# Patient Record
Sex: Female | Born: 1975 | Race: White | Hispanic: No | Marital: Married | Smoking: Former smoker
Health system: Southern US, Community
[De-identification: ages and names within clinical notes are randomized; demographics above are authoritative.]

## PROBLEM LIST (undated history)

## (undated) DIAGNOSIS — IMO0001 Reserved for inherently not codable concepts without codable children: Secondary | ICD-10-CM

## (undated) DIAGNOSIS — E079 Disorder of thyroid, unspecified: Secondary | ICD-10-CM

## (undated) DIAGNOSIS — N390 Urinary tract infection, site not specified: Secondary | ICD-10-CM

## (undated) DIAGNOSIS — N12 Tubulo-interstitial nephritis, not specified as acute or chronic: Secondary | ICD-10-CM

## (undated) DIAGNOSIS — K219 Gastro-esophageal reflux disease without esophagitis: Secondary | ICD-10-CM

## (undated) HISTORY — PX: OTHER SURGICAL HISTORY: SHX169

## (undated) HISTORY — PX: TUBAL LIGATION: SHX77

## (undated) HISTORY — PX: CHOLECYSTECTOMY: SHX55

---

## 1999-11-07 ENCOUNTER — Other Ambulatory Visit: Admission: RE | Admit: 1999-11-07 | Discharge: 1999-11-07 | Payer: Self-pay | Admitting: Obstetrics and Gynecology

## 2001-01-09 ENCOUNTER — Other Ambulatory Visit: Admission: RE | Admit: 2001-01-09 | Discharge: 2001-01-09 | Payer: Self-pay | Admitting: Obstetrics and Gynecology

## 2001-02-11 ENCOUNTER — Encounter (INDEPENDENT_AMBULATORY_CARE_PROVIDER_SITE_OTHER): Payer: Self-pay | Admitting: Specialist

## 2001-02-11 ENCOUNTER — Other Ambulatory Visit: Admission: RE | Admit: 2001-02-11 | Discharge: 2001-02-11 | Payer: Self-pay | Admitting: Obstetrics and Gynecology

## 2008-07-20 ENCOUNTER — Ambulatory Visit: Payer: Self-pay | Admitting: Diagnostic Radiology

## 2008-07-20 ENCOUNTER — Emergency Department (HOSPITAL_BASED_OUTPATIENT_CLINIC_OR_DEPARTMENT_OTHER): Admission: EM | Admit: 2008-07-20 | Discharge: 2008-07-20 | Payer: Self-pay | Admitting: Emergency Medicine

## 2011-06-12 ENCOUNTER — Other Ambulatory Visit: Payer: Self-pay | Admitting: Occupational Medicine

## 2011-06-12 ENCOUNTER — Ambulatory Visit: Payer: Self-pay

## 2011-06-12 DIAGNOSIS — R52 Pain, unspecified: Secondary | ICD-10-CM

## 2011-10-12 ENCOUNTER — Encounter (HOSPITAL_BASED_OUTPATIENT_CLINIC_OR_DEPARTMENT_OTHER): Payer: Self-pay | Admitting: *Deleted

## 2011-10-12 ENCOUNTER — Emergency Department (HOSPITAL_BASED_OUTPATIENT_CLINIC_OR_DEPARTMENT_OTHER)
Admission: EM | Admit: 2011-10-12 | Discharge: 2011-10-12 | Disposition: A | Discharge status: Home / Self Care | Payer: 59 | Attending: Emergency Medicine | Admitting: Emergency Medicine

## 2011-10-12 ENCOUNTER — Emergency Department (INDEPENDENT_AMBULATORY_CARE_PROVIDER_SITE_OTHER): Payer: 59

## 2011-10-12 DIAGNOSIS — B9789 Other viral agents as the cause of diseases classified elsewhere: Secondary | ICD-10-CM | POA: Insufficient documentation

## 2011-10-12 DIAGNOSIS — R05 Cough: Secondary | ICD-10-CM

## 2011-10-12 DIAGNOSIS — R509 Fever, unspecified: Secondary | ICD-10-CM

## 2011-10-12 DIAGNOSIS — R059 Cough, unspecified: Secondary | ICD-10-CM

## 2011-10-12 DIAGNOSIS — R42 Dizziness and giddiness: Secondary | ICD-10-CM

## 2011-10-12 DIAGNOSIS — J3489 Other specified disorders of nose and nasal sinuses: Secondary | ICD-10-CM

## 2011-10-12 DIAGNOSIS — R300 Dysuria: Secondary | ICD-10-CM | POA: Insufficient documentation

## 2011-10-12 DIAGNOSIS — J069 Acute upper respiratory infection, unspecified: Secondary | ICD-10-CM | POA: Insufficient documentation

## 2011-10-12 DIAGNOSIS — B349 Viral infection, unspecified: Secondary | ICD-10-CM

## 2011-10-12 DIAGNOSIS — E860 Dehydration: Secondary | ICD-10-CM | POA: Insufficient documentation

## 2011-10-12 LAB — URINALYSIS, ROUTINE W REFLEX MICROSCOPIC
Glucose, UA: NEGATIVE mg/dL
Ketones, ur: NEGATIVE mg/dL
Leukocytes, UA: NEGATIVE
Nitrite: NEGATIVE
Protein, ur: NEGATIVE mg/dL
Urobilinogen, UA: 0.2 mg/dL (ref 0.0–1.0)

## 2011-10-12 LAB — COMPREHENSIVE METABOLIC PANEL
Alkaline Phosphatase: 32 U/L — ABNORMAL LOW (ref 39–117)
BUN: 9 mg/dL (ref 6–23)
Chloride: 109 mEq/L (ref 96–112)
Creatinine, Ser: 0.7 mg/dL (ref 0.50–1.10)
GFR calc Af Amer: >90 mL/min (ref >90)
GFR calc non Af Amer: >90 mL/min (ref >90)
Glucose, Bld: 83 mg/dL (ref 70–99)
Potassium: 3.7 mEq/L (ref 3.5–5.1)
Total Bilirubin: 0.1 mg/dL — ABNORMAL LOW (ref 0.3–1.2)

## 2011-10-12 LAB — DIFFERENTIAL
Lymphs Abs: 1 10*3/uL (ref 0.7–4.0)
Monocytes Absolute: 0.6 10*3/uL (ref 0.1–1.0)
Monocytes Relative: 14 % — ABNORMAL HIGH (ref 3–12)
Neutro Abs: 2.7 10*3/uL (ref 1.7–7.7)
Neutrophils Relative %: 62 % (ref 43–77)

## 2011-10-12 LAB — URINE MICROSCOPIC-ADD ON

## 2011-10-12 LAB — CBC
HCT: 31.8 % — ABNORMAL LOW (ref 36.0–46.0)
Hemoglobin: 10.7 g/dL — ABNORMAL LOW (ref 12.0–15.0)
MCH: 32.2 pg (ref 26.0–34.0)
RBC: 3.32 MIL/uL — ABNORMAL LOW (ref 3.87–5.11)

## 2011-10-12 MED ORDER — SODIUM CHLORIDE 0.9 % IV BOLUS (SEPSIS)
1000.0000 mL | Freq: Once | INTRAVENOUS | Status: AC
  Administered 2011-10-12: 1000 mL via INTRAVENOUS

## 2011-10-12 MED ORDER — OXYMETAZOLINE HCL 0.05 % NA SOLN
2.0000 | Freq: Once | NASAL | Status: AC
  Administered 2011-10-12: 2 via NASAL
  Filled 2011-10-12: qty 15

## 2011-10-12 MED ORDER — KETOROLAC TROMETHAMINE 30 MG/ML IJ SOLN
30.0000 mg | Freq: Once | INTRAMUSCULAR | Status: AC
  Administered 2011-10-12: 30 mg via INTRAVENOUS
  Filled 2011-10-12: qty 1

## 2011-10-12 NOTE — ED Notes (Signed)
Patient c/o generalized body aches, cough, sinus congestion, diarrhea/vomiting over the  Past week, lack of appetite

## 2011-10-12 NOTE — Discharge Instructions (Signed)
Upper Respiratory Infection, Adult An upper respiratory infection (URI) is also sometimes known as the common cold. The upper respiratory tract includes the nose, sinuses, throat, trachea, and bronchi. Bronchi are the airways leading to the lungs. Most people improve within 1 week, but symptoms can last up to 2 weeks. A residual cough may last even longer.  CAUSES Many different viruses can infect the tissues lining the upper respiratory tract. The tissues become irritated and inflamed and often become very moist. Mucus production is also common. A cold is contagious. You can easily spread the virus to others by oral contact. This includes kissing, sharing a glass, coughing, or sneezing. Touching your mouth or nose and then touching a surface, which is then touched by another person, can also spread the virus. SYMPTOMS  Symptoms typically develop 1 to 3 days after you come in contact with a cold virus. Symptoms vary from person to person. They may include:  Runny nose.   Sneezing.   Nasal congestion.   Sinus irritation.   Sore throat.   Loss of voice (laryngitis).   Cough.   Fatigue.   Muscle aches.   Loss of appetite.   Headache.   Low-grade fever.  DIAGNOSIS  You might diagnose your own cold based on familiar symptoms, since most people get a cold 2 to 3 times a year. Your caregiver can confirm this based on your exam. Most importantly, your caregiver can check that your symptoms are not due to another disease such as strep throat, sinusitis, pneumonia, asthma, or epiglottitis. Blood tests, throat tests, and X-rays are not necessary to diagnose a common cold, but they may sometimes be helpful in excluding other more serious diseases. Your caregiver will decide if any further tests are required. RISKS AND COMPLICATIONS  You may be at risk for a more severe case of the common cold if you smoke cigarettes, have chronic heart disease (such as heart failure) or lung disease (such as  asthma), or if you have a weakened immune system. The very young and very old are also at risk for more serious infections. Bacterial sinusitis, middle ear infections, and bacterial pneumonia can complicate the common cold. The common cold can worsen asthma and chronic obstructive pulmonary disease (COPD). Sometimes, these complications can require emergency medical care and may be life-threatening. PREVENTION  The best way to protect against getting a cold is to practice good hygiene. Avoid oral or hand contact with people with cold symptoms. Wash your hands often if contact occurs. There is no clear evidence that vitamin C, vitamin E, echinacea, or exercise reduces the chance of developing a cold. However, it is always recommended to get plenty of rest and practice good nutrition. TREATMENT  Treatment is directed at relieving symptoms. There is no cure. Antibiotics are not effective, because the infection is caused by a virus, not by bacteria. Treatment may include:  Increased fluid intake. Sports drinks offer valuable electrolytes, sugars, and fluids.   Breathing heated mist or steam (vaporizer or shower).   Eating chicken soup or other clear broths, and maintaining good nutrition.   Getting plenty of rest.   Using gargles or lozenges for comfort.   Controlling fevers with ibuprofen or acetaminophen as directed by your caregiver.   Increasing usage of your inhaler if you have asthma.  Zinc gel and zinc lozenges, taken in the first 24 hours of the common cold, can shorten the duration and lessen the severity of symptoms. Pain medicines may help with fever, muscle   aches, and throat pain. A variety of non-prescription medicines are available to treat congestion and runny nose. Your caregiver can make recommendations and may suggest nasal or lung inhalers for other symptoms.  HOME CARE INSTRUCTIONS   Only take over-the-counter or prescription medicines for pain, discomfort, or fever as directed  by your caregiver.   Use a warm mist humidifier or inhale steam from a shower to increase air moisture. This may keep secretions moist and make it easier to breathe.   Drink enough water and fluids to keep your urine clear or pale yellow.   Rest as needed.   Return to work when your temperature has returned to normal or as your caregiver advises. You may need to stay home longer to avoid infecting others. You can also use a face mask and careful hand washing to prevent spread of the virus.  SEEK MEDICAL CARE IF:   After the first few days, you feel you are getting worse rather than better.   You need your caregiver's advice about medicines to control symptoms.   You develop chills, worsening shortness of breath, or brown or red sputum. These may be signs of pneumonia.   You develop yellow or brown nasal discharge or pain in the face, especially when you bend forward. These may be signs of sinusitis.   You develop a fever, swollen neck glands, pain with swallowing, or white areas in the back of your throat. These may be signs of strep throat.  SEEK IMMEDIATE MEDICAL CARE IF:   You have a fever.   You develop severe or persistent headache, ear pain, sinus pain, or chest pain.   You develop wheezing, a prolonged cough, cough up blood, or have a change in your usual mucus (if you have chronic lung disease).   You develop sore muscles or a stiff neck.  Document Released: 12/27/2000 Document Revised: 06/22/2011 Document Reviewed: 11/04/2010 [facility] Patient Information 2012 [place], [place].Dehydration, Adult Dehydration is when you lose more fluids from the body than you take in. Vital organs like the kidneys, brain, and heart cannot function without a proper amount of fluids and salt. Any loss of fluids from the body can cause dehydration.  CAUSES   Vomiting.   Diarrhea.   Excessive sweating.   Excessive urine output.   Fever.  SYMPTOMS  Mild dehydration  Thirst.   Dry  lips.   Slightly dry mouth.  Moderate dehydration  Very dry mouth.   Sunken eyes.   Skin does not bounce back quickly when lightly pinched and released.   Dark urine and decreased urine production.   Decreased tear production.   Headache.  Severe dehydration  Very dry mouth.   Extreme thirst.   Rapid, weak pulse (more than 100 beats per minute at rest).   Cold hands and feet.   Not able to sweat in spite of heat and temperature.   Rapid breathing.   Blue lips.   Confusion and lethargy.   Difficulty being awakened.   Minimal urine production.   No tears.  DIAGNOSIS  Your caregiver will diagnose dehydration based on your symptoms and your exam. Blood and urine tests will help confirm the diagnosis. The diagnostic evaluation should also identify the cause of dehydration. TREATMENT  Treatment of mild or moderate dehydration can often be done at home by increasing the amount of fluids that you drink. It is best to drink small amounts of fluid more often. Drinking too much at one time can make vomiting worse. Refer  to the home care instructions below. Severe dehydration needs to be treated at the hospital where you will probably be given intravenous (IV) fluids that contain water and electrolytes. HOME CARE INSTRUCTIONS   Ask your caregiver about specific rehydration instructions.   Drink enough fluids to keep your urine clear or pale yellow.   Drink small amounts frequently if you have nausea and vomiting.   Eat as you normally do.   Avoid:   Foods or drinks high in sugar.   Carbonated drinks.   Juice.   Extremely hot or cold fluids.   Drinks with caffeine.   Fatty, greasy foods.   Alcohol.   Tobacco.   Overeating.   Gelatin desserts.   Wash your hands well to avoid spreading bacteria and viruses.   Only take over-the-counter or prescription medicines for pain, discomfort, or fever as directed by your caregiver.   Ask your caregiver if you  should continue all prescribed and over-the-counter medicines.   Keep all follow-up appointments with your caregiver.  SEEK MEDICAL CARE IF:  You have abdominal pain and it increases or stays in one area (localizes).   You have a rash, stiff neck, or severe headache.   You are irritable, sleepy, or difficult to awaken.   You are weak, dizzy, or extremely thirsty.  SEEK IMMEDIATE MEDICAL CARE IF:   You are unable to keep fluids down or you get worse despite treatment.   You have frequent episodes of vomiting or diarrhea.   You have blood or green matter (bile) in your vomit.   You have blood in your stool or your stool looks black and tarry.   You have not urinated in 6 to 8 hours, or you have only urinated a small amount of very dark urine.   You have a fever.   You faint.  MAKE SURE YOU:   Understand these instructions.   Will watch your condition.   Will get help right away if you are not doing well or get worse.  Document Released: 07/03/2005 Document Revised: 06/22/2011 Document Reviewed: 02/20/2011 [facility] Patient Information 2012 [place], [place].

## 2011-10-12 NOTE — ED Notes (Signed)
Monday began to have sore throat cough congestion high fever with hallucinations chills eyes itching and watering 2 weeks ago while on vacation had GI bug with nausea and vomiting that lasted over a week.

## 2011-10-12 NOTE — ED Provider Notes (Signed)
History     CSN: [id number]  Arrival date & time 10/12/11  1191   First MD Initiated Contact with Patient 10/12/11 [phone]      Chief Complaint  Patient presents with  . Cough  . Nasal Congestion  . Dizziness  . Fever    (Consider location/radiation/quality/duration/timing/severity/associated sxs/prior treatment) HPI Patient is a 36 year old female who presents today complaining of congestion, sore throat, and dry cough over the past 3 days. Patient also describes feeling lightheaded with this and is tachycardic on presentation. She has had fevers at home as high as the 102. Patient also endorses urinary frequency as well as mild dysuria. This is similar to prior UTIs she has had. She does not suspect that she could be pregnant. Though the patient currently has no abdominal pain or other GI symptoms she does note that last week for 5-7 days that she had nausea, vomiting, and diarrhea with abdominal pain. During this time she was on vacation in [place]. All of these symptoms have resolved. Patient has tried over-the-counter remedies without relief.There are no other associated or modifying factors.  History reviewed. No pertinent past medical history.  History reviewed. No pertinent past surgical history.  History reviewed. No pertinent family history.  History  Substance Use Topics  . Smoking status: Former Games developer  . Smokeless tobacco: Not on file  . Alcohol Use: Yes    OB History    Grav Para Term Preterm Abortions TAB SAB Ect Mult Living                  Review of Systems  Constitutional: Positive for fever, chills and fatigue.  HENT: Positive for congestion and sore throat.   Eyes: Negative.   Respiratory: Positive for cough.   Cardiovascular: Negative.   Gastrointestinal: Negative.   Genitourinary: Positive for dysuria and frequency.  Musculoskeletal: Negative.   Skin: Negative.   Neurological: Positive for light-headedness.  Hematological: Negative.     Psychiatric/Behavioral: Negative.   All other systems reviewed and are negative.    Allergies  Penicillins and Zithromax  Home Medications   Current Outpatient Rx  Name Route Sig Dispense Refill  . FLUOXETINE HCL 10 MG PO CAPS Oral Take 20 mg by mouth daily.      BP 126/78  Pulse 110  Temp(Src) 99 F (37.2 C) (Oral)  Resp 20  Ht 5\' 9"  (1.753 m)  Wt 149 lb 6 oz (67.756 kg)  BMI 22.06 kg/m2  SpO2 100%  LMP 09/28/2011  Physical Exam GEN: Well-developed, well-nourished female in no distress. uncomfortable HEENT: Atraumatic, normocephalic. Symmetric erythematous posterior oropharynx without cobblestoning or exudate. Bilateral nasal congestion.  EYES: PERRLA BL, no scleral icterus. NECK: Trachea midline, no meningismus CV: Tachycardia with regular rhythm. No murmurs, rubs, or gallops PULM: No respiratory distress.  No crackles, wheezes, or rales. GI: soft, non-tender. No guarding, rebound, or tenderness. + bowel sounds  GU: deferred Neuro: cranial nerves 2-12 intact, no abnormalities of strength or sensation, A and O x 3 MSK: Patient moves all 4 extremities symmetrically, no deformity, edema, or injury noted Skin: No rashes petechiae, purpura, or jaundice Psych: no abnormality of mood  ED Course  Procedures (including critical care time)  Labs Reviewed  CBC - Abnormal; Notable for the following:    RBC 3.32 (*)    Hemoglobin 10.7 (*)    HCT 31.8 (*)    All other components within normal limits  DIFFERENTIAL - Abnormal; Notable for the following:    Monocytes Relative  14 (*)    All other components within normal limits  COMPREHENSIVE METABOLIC PANEL - Abnormal; Notable for the following:    Calcium 8.1 (*)    Total Protein 5.8 (*)    Albumin 3.3 (*)    Alkaline Phosphatase 32 (*)    Total Bilirubin 0.1 (*)    All other components within normal limits  URINALYSIS, ROUTINE W REFLEX MICROSCOPIC - Abnormal; Notable for the following:    APPearance CLOUDY (*)    Hgb  urine dipstick LARGE (*)    All other components within normal limits  URINE MICROSCOPIC-ADD ON - Abnormal; Notable for the following:    Squamous Epithelial / LPF MANY (*)    Bacteria, UA MANY (*)    All other components within normal limits  PREGNANCY, URINE   Dg Chest 2 View  10/12/2011  *RADIOLOGY REPORT*  Clinical Data: Cough, congestion, fever  CHEST - 2 VIEW  Comparison: None.  Findings: The cardiac silhouette, mediastinum, pulmonary vasculature are within normal limits.  Both lungs are clear. There is no acute bony abnormality.  IMPRESSION: There is no evidence of acute cardiac or pulmonary process.  Original Report Authenticated By: Brandon Melnick, M.D.     1. Acute URI   2. Dehydration   3. Viral syndrome       MDM  Patient was evaluated by myself. Based on presentation most likely diagnosis was upper respiratory infection with associated dehydration. Chest x-ray was performed to confirm that the patient did not have a pneumonia. This was negative. Urinalysis also was within normal limits. Patient was rehydrated with 2 L normal saline IV bolus. She received Toradol as well as Afrin and was feeling much better. She was discharged with instructions to use warm salt water gargle for throat as well as ibuprofen and Tylenol. She also was told that she can use Afrin for 3 days. She can use saline nasal spray as well as increased fluid intake as well. Patient was comfortable with plan for discharge home and was discharged home in improved condition.        Cyndra Numbers, MD 10/12/11 1259

## 2012-05-04 ENCOUNTER — Emergency Department (HOSPITAL_BASED_OUTPATIENT_CLINIC_OR_DEPARTMENT_OTHER)
Admission: EM | Admit: 2012-05-04 | Discharge: 2012-05-04 | Disposition: A | Discharge status: Home / Self Care | Payer: Managed Care, Other (non HMO) | Attending: Emergency Medicine | Admitting: Emergency Medicine

## 2012-05-04 ENCOUNTER — Encounter (HOSPITAL_BASED_OUTPATIENT_CLINIC_OR_DEPARTMENT_OTHER): Payer: Self-pay | Admitting: *Deleted

## 2012-05-04 DIAGNOSIS — N12 Tubulo-interstitial nephritis, not specified as acute or chronic: Secondary | ICD-10-CM | POA: Insufficient documentation

## 2012-05-04 HISTORY — DX: Urinary tract infection, site not specified: N39.0

## 2012-05-04 HISTORY — DX: Gastro-esophageal reflux disease without esophagitis: K21.9

## 2012-05-04 HISTORY — DX: Reserved for inherently not codable concepts without codable children: IMO0001

## 2012-05-04 HISTORY — DX: Tubulo-interstitial nephritis, not specified as acute or chronic: N12

## 2012-05-04 LAB — URINALYSIS, ROUTINE W REFLEX MICROSCOPIC
Glucose, UA: NEGATIVE mg/dL
Protein, ur: NEGATIVE mg/dL
Specific Gravity, Urine: 1.005 (ref 1.005–1.030)

## 2012-05-04 LAB — PREGNANCY, URINE: Preg Test, Ur: NEGATIVE

## 2012-05-04 LAB — URINE MICROSCOPIC-ADD ON

## 2012-05-04 MED ORDER — CIPROFLOXACIN HCL 500 MG PO TABS: 500.0000 mg | ORAL_TABLET | Freq: Two times a day (BID) | ORAL | Status: DC

## 2012-05-04 MED ORDER — PHENAZOPYRIDINE HCL 100 MG PO TABS
200.0000 mg | ORAL_TABLET | Freq: Once | ORAL | Status: AC
  Administered 2012-05-04: 200 mg via ORAL
  Filled 2012-05-04 (×2): qty 1

## 2012-05-04 MED ORDER — PHENAZOPYRIDINE HCL 200 MG PO TABS: 200.0000 mg | ORAL_TABLET | Freq: Three times a day (TID) | ORAL | Status: DC

## 2012-05-04 MED ORDER — CIPROFLOXACIN HCL 500 MG PO TABS
500.0000 mg | ORAL_TABLET | Freq: Once | ORAL | Status: AC
  Administered 2012-05-04: 500 mg via ORAL
  Filled 2012-05-04: qty 1

## 2012-05-04 NOTE — ED Notes (Addendum)
Pt has hx of bladder reflux, UTI and has had s/s x 1 week. Now moving into right flank area. Has been hospitalized with pyelonephritis before.

## 2012-05-04 NOTE — ED Provider Notes (Signed)
History     CSN: [id number]  Arrival date & time 05/04/12  2244   First MD Initiated Contact with Patient 05/04/12 2330      Chief Complaint  Patient presents with  . Urinary Tract Infection    (Consider location/radiation/quality/duration/timing/severity/associated sxs/prior treatment) HPI Is a 36 year old white female with a history of frequent urinary tract infections. She's had a week of suprapubic discomfort and moderate to severe burning with urination. She attempted to treat herself with increased fluid intake without success. She is now having pain in her right flank consistent with previous episodes of pyelonephritis. She describes the pain as a throbbing. It is moderate severity. She's not aware of having a fever but has had chills and nausea.  Past Medical History  Diagnosis Date  . UTI (lower urinary tract infection)   . Reflux   . Pyelonephritis     History reviewed. No pertinent past surgical history.  History reviewed. No pertinent family history.  History  Substance Use Topics  . Smoking status: Former Games developer  . Smokeless tobacco: Not on file  . Alcohol Use: Yes    OB History    Grav Para Term Preterm Abortions TAB SAB Ect Mult Living                  Review of Systems  All other systems reviewed and are negative.    Allergies  Penicillins and Zithromax  Home Medications   Current Outpatient Rx  Name Route Sig Dispense Refill  . FLUOXETINE HCL 10 MG PO CAPS Oral Take 20 mg by mouth daily.      BP 138/86  Pulse 102  Temp 98.4 F (36.9 C) (Oral)  Resp 18  Ht 5\' 9"  (1.753 m)  Wt 146 lb (66.225 kg)  BMI 21.56 kg/m2  SpO2 100%  LMP 04/27/2012  Physical Exam General: Well-developed, well-nourished female in no acute distress; appearance consistent with age of record HENT: normocephalic, atraumatic Eyes: pupils equal round and reactive to light; extraocular muscles intact Neck: supple Heart: regular rate and rhythm Lungs: clear to  auscultation bilaterally Abdomen: soft; nondistended; suprapubic tenderness; no masses or hepatosplenomegaly; bowel sounds present GU: Right CVA tenderness Extremities: No deformity; full range of motion Neurologic: Awake, alert and oriented; motor function intact in all extremities and symmetric; no facial droop Skin: Warm and dry Psychiatric: Normal mood and affect    ED Course  Procedures (including critical care time)    MDM   Nursing notes and vitals signs, including pulse oximetry, reviewed.  Summary of this visit's results, reviewed by myself:  Labs:  Results for orders placed during the hospital encounter of 05/04/12  URINALYSIS, ROUTINE W REFLEX MICROSCOPIC      Component Value Range   Color, Urine ORANGE (*) YELLOW   APPearance CLOUDY (*) CLEAR   Specific Gravity, Urine 1.005  1.005 - 1.030   pH 6.0  5.0 - 8.0   Glucose, UA NEGATIVE  NEGATIVE mg/dL   Hgb urine dipstick TRACE (*) NEGATIVE   Bilirubin Urine NEGATIVE  NEGATIVE   Ketones, ur NEGATIVE  NEGATIVE mg/dL   Protein, ur NEGATIVE  NEGATIVE mg/dL   Urobilinogen, UA 1.0  0.0 - 1.0 mg/dL   Nitrite POSITIVE (*) NEGATIVE   Leukocytes, UA LARGE (*) NEGATIVE  PREGNANCY, URINE      Component Value Range   Preg Test, Ur NEGATIVE  NEGATIVE  URINE MICROSCOPIC-ADD ON      Component Value Range   Squamous Epithelial / LPF FEW (*)  RARE   WBC, UA 21-50  <3 WBC/hpf   RBC / HPF 0-2  <3 RBC/hpf   Bacteria, UA FEW (*) RARE           Hanley Seamen, MD 05/04/12 2337

## 2012-05-07 LAB — URINE CULTURE: Colony Count: >=100000

## 2012-05-08 NOTE — ED Notes (Signed)
+   Urine Patient treated with cipro-sensitive to same-chart appended per protocol MD. 

## 2012-06-16 ENCOUNTER — Emergency Department (HOSPITAL_BASED_OUTPATIENT_CLINIC_OR_DEPARTMENT_OTHER): Payer: Managed Care, Other (non HMO)

## 2012-06-16 ENCOUNTER — Emergency Department (HOSPITAL_BASED_OUTPATIENT_CLINIC_OR_DEPARTMENT_OTHER)
Admission: EM | Admit: 2012-06-16 | Discharge: 2012-06-16 | Disposition: A | Discharge status: Home / Self Care | Payer: Managed Care, Other (non HMO) | Attending: Emergency Medicine | Admitting: Emergency Medicine

## 2012-06-16 ENCOUNTER — Encounter (HOSPITAL_BASED_OUTPATIENT_CLINIC_OR_DEPARTMENT_OTHER): Payer: Self-pay | Admitting: Emergency Medicine

## 2012-06-16 DIAGNOSIS — Z87448 Personal history of other diseases of urinary system: Secondary | ICD-10-CM | POA: Insufficient documentation

## 2012-06-16 DIAGNOSIS — W010XXA Fall on same level from slipping, tripping and stumbling without subsequent striking against object, initial encounter: Secondary | ICD-10-CM | POA: Insufficient documentation

## 2012-06-16 DIAGNOSIS — Z87440 Personal history of urinary (tract) infections: Secondary | ICD-10-CM | POA: Insufficient documentation

## 2012-06-16 DIAGNOSIS — Y929 Unspecified place or not applicable: Secondary | ICD-10-CM | POA: Insufficient documentation

## 2012-06-16 DIAGNOSIS — S300XXA Contusion of lower back and pelvis, initial encounter: Secondary | ICD-10-CM

## 2012-06-16 DIAGNOSIS — Z8719 Personal history of other diseases of the digestive system: Secondary | ICD-10-CM | POA: Insufficient documentation

## 2012-06-16 DIAGNOSIS — Y93K1 Activity, walking an animal: Secondary | ICD-10-CM | POA: Insufficient documentation

## 2012-06-16 DIAGNOSIS — Z87891 Personal history of nicotine dependence: Secondary | ICD-10-CM | POA: Insufficient documentation

## 2012-06-16 MED ORDER — HYDROCODONE-ACETAMINOPHEN 5-500 MG PO TABS: 1.0000 | ORAL_TABLET | Freq: Four times a day (QID) | ORAL | Status: DC | PRN

## 2012-06-16 NOTE — ED Notes (Signed)
Pt fell over her dog and landed onto her tailbone.  Pt c/o pain at gluteal fold and radiates up to the left.  No decreased sensation or movement.  No elimination problems.

## 2012-06-16 NOTE — ED Provider Notes (Signed)
History     CSN: [id number]  Arrival date & time 06/16/12  1037   First MD Initiated Contact with Patient 06/16/12 1120      Chief Complaint  Patient presents with  . Fall  . Tailbone Pain    (Consider location/radiation/quality/duration/timing/severity/associated sxs/prior treatment) HPI Comments: Larey Seat while walking dog two days ago, injured tailbone.  Pain worse this morning.  Patient is a 36 y.o. female presenting with fall. The history is provided by the patient.  Fall The accident occurred 2 days ago. The fall occurred while walking. She fell from a height of 1 to 2 ft. She landed on dirt. There was no blood loss. Point of impact: buttock. Pain location: buttock. The pain is moderate.    Past Medical History  Diagnosis Date  . UTI (lower urinary tract infection)   . Reflux   . Pyelonephritis     Past Surgical History  Procedure Date  . Arm surgery     No family history on file.  History  Substance Use Topics  . Smoking status: Former Games developer  . Smokeless tobacco: Not on file  . Alcohol Use: Yes    OB History    Grav Para Term Preterm Abortions TAB SAB Ect Mult Living                  Review of Systems  All other systems reviewed and are negative.    Allergies  Erythromycin; Penicillins; Rocephin; and Zithromax  Home Medications   Current Outpatient Rx  Name  Route  Sig  Dispense  Refill  . ETONOGESTREL-ETHINYL ESTRADIOL 0.12-0.015 MG/24HR VA RING   Vaginal   Place 1 each vaginally every 28 (twenty-eight) days. Insert vaginally and leave in place for 3 consecutive weeks, then remove for 1 week.         . IBUPROFEN 200 MG PO TABS   Oral   Take 600 mg by mouth every 6 (six) hours as needed.           BP 120/76  Pulse 98  Temp 98.1 F (36.7 C) (Oral)  Resp 16  SpO2 100%  LMP 06/02/2012  Physical Exam  Nursing note and vitals reviewed. Constitutional: She is oriented to person, place, and time. She appears well-developed and  well-nourished.  HENT:  Head: Normocephalic and atraumatic.  Neck: Normal range of motion. Neck supple.  Musculoskeletal:       There is ttp over the tailbone/coccyx.  Strength, motor, sensation intact in both lower extremities.  Neurological: She is alert and oriented to person, place, and time.  Skin: Skin is warm and dry.    ED Course  Procedures (including critical care time)  Labs Reviewed - No data to display No results found.   No diagnosis found.    MDM  The xrays are negative.  Will treat with pain meds, pillow, stool softener.        Geoffery Lyons, MD 06/16/12 [phone]

## 2012-08-31 ENCOUNTER — Other Ambulatory Visit: Payer: Self-pay

## 2013-01-02 ENCOUNTER — Emergency Department (HOSPITAL_BASED_OUTPATIENT_CLINIC_OR_DEPARTMENT_OTHER)
Admission: EM | Admit: 2013-01-02 | Discharge: 2013-01-02 | Disposition: A | Discharge status: Home / Self Care | Payer: Managed Care, Other (non HMO) | Attending: Emergency Medicine | Admitting: Emergency Medicine

## 2013-01-02 ENCOUNTER — Encounter (HOSPITAL_BASED_OUTPATIENT_CLINIC_OR_DEPARTMENT_OTHER): Payer: Self-pay

## 2013-01-02 DIAGNOSIS — M545 Low back pain, unspecified: Secondary | ICD-10-CM | POA: Insufficient documentation

## 2013-01-02 DIAGNOSIS — R112 Nausea with vomiting, unspecified: Secondary | ICD-10-CM | POA: Insufficient documentation

## 2013-01-02 DIAGNOSIS — Z8639 Personal history of other endocrine, nutritional and metabolic disease: Secondary | ICD-10-CM | POA: Insufficient documentation

## 2013-01-02 DIAGNOSIS — R109 Unspecified abdominal pain: Secondary | ICD-10-CM | POA: Insufficient documentation

## 2013-01-02 DIAGNOSIS — Z87448 Personal history of other diseases of urinary system: Secondary | ICD-10-CM | POA: Insufficient documentation

## 2013-01-02 DIAGNOSIS — Z8719 Personal history of other diseases of the digestive system: Secondary | ICD-10-CM | POA: Insufficient documentation

## 2013-01-02 DIAGNOSIS — Z862 Personal history of diseases of the blood and blood-forming organs and certain disorders involving the immune mechanism: Secondary | ICD-10-CM | POA: Insufficient documentation

## 2013-01-02 DIAGNOSIS — N390 Urinary tract infection, site not specified: Secondary | ICD-10-CM

## 2013-01-02 DIAGNOSIS — R300 Dysuria: Secondary | ICD-10-CM | POA: Insufficient documentation

## 2013-01-02 DIAGNOSIS — Z880 Allergy status to penicillin: Secondary | ICD-10-CM | POA: Insufficient documentation

## 2013-01-02 DIAGNOSIS — Z87891 Personal history of nicotine dependence: Secondary | ICD-10-CM | POA: Insufficient documentation

## 2013-01-02 DIAGNOSIS — Z3202 Encounter for pregnancy test, result negative: Secondary | ICD-10-CM | POA: Insufficient documentation

## 2013-01-02 HISTORY — DX: Disorder of thyroid, unspecified: E07.9

## 2013-01-02 LAB — URINALYSIS, ROUTINE W REFLEX MICROSCOPIC
Bilirubin Urine: NEGATIVE
Glucose, UA: NEGATIVE mg/dL
Ketones, ur: NEGATIVE mg/dL
Nitrite: NEGATIVE
Protein, ur: NEGATIVE mg/dL
Specific Gravity, Urine: 1.017 (ref 1.005–1.030)
Urobilinogen, UA: 0.2 mg/dL (ref 0.0–1.0)
pH: 7.5 (ref 5.0–8.0)

## 2013-01-02 LAB — URINE MICROSCOPIC-ADD ON

## 2013-01-02 LAB — PREGNANCY, URINE: Preg Test, Ur: NEGATIVE

## 2013-01-02 MED ORDER — PROMETHAZINE HCL 25 MG PO TABS: 25.0000 mg | ORAL_TABLET | Freq: Four times a day (QID) | ORAL | Status: DC | PRN

## 2013-01-02 MED ORDER — CIPROFLOXACIN HCL 500 MG PO TABS: 500.0000 mg | ORAL_TABLET | Freq: Two times a day (BID) | ORAL | Status: DC

## 2013-01-02 MED ORDER — HYDROCODONE-ACETAMINOPHEN 5-325 MG PO TABS: ORAL_TABLET | ORAL | Status: DC

## 2013-01-02 MED ORDER — CIPROFLOXACIN HCL 500 MG PO TABS
500.0000 mg | ORAL_TABLET | Freq: Once | ORAL | Status: AC
  Administered 2013-01-02: 500 mg via ORAL
  Filled 2013-01-02: qty 1

## 2013-01-02 MED ORDER — HYDROCODONE-ACETAMINOPHEN 5-325 MG PO TABS
2.0000 | ORAL_TABLET | Freq: Once | ORAL | Status: AC
  Administered 2013-01-02: 2 via ORAL
  Filled 2013-01-02: qty 2

## 2013-01-02 MED ORDER — ONDANSETRON 8 MG PO TBDP
8.0000 mg | ORAL_TABLET | Freq: Once | ORAL | Status: AC
  Administered 2013-01-02: 8 mg via ORAL
  Filled 2013-01-02: qty 1

## 2013-01-02 NOTE — ED Notes (Signed)
Pt reports onset of low back pain Sunday and developed urinary frequency and left flank pain today.

## 2013-01-02 NOTE — Discharge Instructions (Signed)
 Urinary Tract Infection Urinary tract infections (UTIs) can develop anywhere along your urinary tract. Your urinary tract is your body's drainage system for removing wastes and extra water. Your urinary tract includes two kidneys, two ureters, a bladder, and a urethra. Your kidneys are a pair of bean-shaped organs. Each kidney is about the size of your fist. They are located below your ribs, one on each side of your spine. CAUSES Infections are caused by microbes, which are microscopic organisms, including fungi, viruses, and bacteria. These organisms are so small that they can only be seen through a microscope. Bacteria are the microbes that most commonly cause UTIs. SYMPTOMS  Symptoms of UTIs may vary by age and gender of the patient and by the location of the infection. Symptoms in young women typically include a frequent and intense urge to urinate and a painful, burning feeling in the bladder or urethra during urination. Older women and men are more likely to be tired, shaky, and weak and have muscle aches and abdominal pain. A fever may mean the infection is in your kidneys. Other symptoms of a kidney infection include pain in your back or sides below the ribs, nausea, and vomiting. DIAGNOSIS To diagnose a UTI, your caregiver will ask you about your symptoms. Your caregiver also will ask to provide a urine sample. The urine sample will be tested for bacteria and white blood cells. White blood cells are made by your body to help fight infection. TREATMENT  Typically, UTIs can be treated with medication. Because most UTIs are caused by a bacterial infection, they usually can be treated with the use of antibiotics. The choice of antibiotic and length of treatment depend on your symptoms and the type of bacteria causing your infection. HOME CARE INSTRUCTIONS  If you were prescribed antibiotics, take them exactly as your caregiver instructs you. Finish the medication even if you feel better after you  have only taken some of the medication.  Drink enough water and fluids to keep your urine clear or pale yellow.  Avoid caffeine, tea, and carbonated beverages. They tend to irritate your bladder.  Empty your bladder often. Avoid holding urine for long periods of time.  Empty your bladder before and after sexual intercourse.  After a bowel movement, women should cleanse from front to back. Use each tissue only once. SEEK MEDICAL CARE IF:   You have back pain.  You develop a fever.  Your symptoms do not begin to resolve within 3 days. SEEK IMMEDIATE MEDICAL CARE IF:   You have severe back pain or lower abdominal pain.  You develop chills.  You have nausea or vomiting.  You have continued burning or discomfort with urination. MAKE SURE YOU:   Understand these instructions.  Will watch your condition.  Will get help right away if you are not doing well or get worse. Document Released: 04/12/2005 Document Revised: 01/02/2012 Document Reviewed: 08/11/2011 [facility] Patient Information 2014 [place], [place].   Narcotic and benzodiazepine use may cause drowsiness, slowed breathing or dependence.  Please use with caution and do not drive, operate machinery or watch young children alone while taking them.  Taking combinations of these medications or drinking alcohol will potentiate these effects.

## 2013-01-02 NOTE — ED Provider Notes (Signed)
History     CSN: [id number]  Arrival date & time 01/02/13  1017   First MD Initiated Contact with Patient 01/02/13 1035      Chief Complaint  Patient presents with  . Flank Pain  . Back Pain  . Urinary Frequency    (Consider location/radiation/quality/duration/timing/severity/associated sxs/prior treatment) HPI Comments: Initially was left low back pain, thought it was muscular, did have some vomiting 3 days ago, has resolved.  She had some transient stomach pain and upset and attributed to that.  Now with urinary freq, dysuria and more frank left flank pain, similar to multiple prior kidney infections in the past.  No fevers, chills.  No diarrhea.    Patient is a 37 y.o. female presenting with flank pain, back pain, and frequency. The history is provided by the patient and medical records.  Flank Pain This is a new problem. The current episode started more than 2 days ago. The problem has been gradually worsening. Associated symptoms include abdominal pain. The symptoms are aggravated by walking and twisting. Nothing relieves the symptoms.  Back Pain Location:  Lumbar spine Radiates to:  Does not radiate Onset quality:  Gradual Chronicity:  New Associated symptoms: abdominal pain and dysuria   Associated symptoms: no fever   Abdominal pain:    Location:  L flank   Quality:  Pressure, sharp and shooting   Severity:  Severe   Onset quality:  Gradual   Timing:  Constant   Progression:  Worsening Urinary Frequency Associated symptoms include abdominal pain.    Past Medical History  Diagnosis Date  . UTI (lower urinary tract infection)   . Reflux   . Pyelonephritis   . Thyroid disease     Past Surgical History  Procedure Laterality Date  . Arm surgery    . Laproscopy      History reviewed. No pertinent family history.  History  Substance Use Topics  . Smoking status: Former Games developer  . Smokeless tobacco: Not on file  . Alcohol Use: Yes    OB History   Grav  Para Term Preterm Abortions TAB SAB Ect Mult Living                  Review of Systems  Constitutional: Negative for fever, chills and appetite change.  Gastrointestinal: Positive for nausea, vomiting and abdominal pain.  Genitourinary: Positive for dysuria, frequency and flank pain.  Musculoskeletal: Positive for back pain.  All other systems reviewed and are negative.    Allergies  Erythromycin; Penicillins; Rocephin; Sulfa antibiotics; and Zithromax  Home Medications   Current Outpatient Rx  Name  Route  Sig  Dispense  Refill  . ciprofloxacin (CIPRO) 500 MG tablet   Oral   Take 1 tablet (500 mg total) by mouth 2 (two) times daily.   20 tablet   0   . HYDROcodone-acetaminophen (NORCO/VICODIN) 5-325 MG per tablet      1-2 tablets po q 6 hours prn moderate to severe pain   20 tablet   0   . promethazine (PHENERGAN) 25 MG tablet   Oral   Take 1 tablet (25 mg total) by mouth every 6 (six) hours as needed for nausea.   20 tablet   0     BP 118/71  Pulse 109  Temp(Src) 98.4 F (36.9 C) (Oral)  Resp 16  Ht 5\' 9"  (1.753 m)  Wt 160 lb (72.576 kg)  BMI 23.62 kg/m2  SpO2 99%  Physical Exam  Nursing note and  vitals reviewed. Constitutional: She appears well-developed and well-nourished.  HENT:  Head: Normocephalic and atraumatic.  Eyes: Conjunctivae and EOM are normal. No scleral icterus.  Neck: Neck supple.  Cardiovascular: Normal rate and intact distal pulses.   Pulmonary/Chest: Effort normal. No respiratory distress.  Abdominal: Soft. She exhibits no distension. There is no tenderness.  Neurological: She is alert.  Skin: Skin is warm. No rash noted.    ED Course  Procedures (including critical care time)  Labs Reviewed  URINALYSIS, ROUTINE W REFLEX MICROSCOPIC - Abnormal; Notable for the following:    Hgb urine dipstick TRACE (*)    Leukocytes, UA MODERATE (*)    All other components within normal limits  URINE MICROSCOPIC-ADD ON - Abnormal; Notable  for the following:    Squamous Epithelial / LPF FEW (*)    Bacteria, UA MANY (*)    All other components within normal limits  URINE CULTURE  PREGNANCY, URINE   No results found.   1. Urinary tract infection     ra sat is 99% and I interpret to be normal  MDM  Pt with tenderness to left low back and flank.  UA suggests UTI, along with similar symptoms, will treat as complicated UTI with analgesics, po cipro for 10 days.  Culture sent.          Gavin Pound. Yenesis Even, MD 01/02/13 1116

## 2013-01-04 LAB — URINE CULTURE: Colony Count: >=100000

## 2013-01-05 ENCOUNTER — Telehealth (HOSPITAL_COMMUNITY): Payer: Self-pay | Admitting: Emergency Medicine

## 2013-01-05 NOTE — ED Notes (Signed)
Post ED Visit - Positive Culture Follow-up  Culture report reviewed by antimicrobial stewardship pharmacist: []  Wes Dulaney, Pharm.D., BCPS []  Celedonio Miyamoto, Pharm.D., BCPS []  Georgina Pillion, Pharm.D., BCPS []  [place], [address].D., BCPS, AAHIVP []  Estella Husk, Pharm.D., BCPS, AAHIVP [x]  Laurence Slate, [address].D., BCPS  Positive urine culture Treated with Cipro, organism sensitive to the same and no further patient follow-up is required at this time.  [name] 01/05/2013, 11:21 AM

## 2013-05-22 ENCOUNTER — Other Ambulatory Visit: Payer: Self-pay

## 2018-01-12 ENCOUNTER — Emergency Department (HOSPITAL_BASED_OUTPATIENT_CLINIC_OR_DEPARTMENT_OTHER)
Admission: EM | Admit: 2018-01-12 | Discharge: 2018-01-12 | Disposition: A | Discharge status: Home / Self Care | Payer: Managed Care, Other (non HMO) | Attending: Emergency Medicine | Admitting: Emergency Medicine

## 2018-01-12 ENCOUNTER — Encounter (HOSPITAL_BASED_OUTPATIENT_CLINIC_OR_DEPARTMENT_OTHER): Payer: Self-pay | Admitting: Emergency Medicine

## 2018-01-12 ENCOUNTER — Other Ambulatory Visit: Payer: Self-pay

## 2018-01-12 ENCOUNTER — Emergency Department (HOSPITAL_BASED_OUTPATIENT_CLINIC_OR_DEPARTMENT_OTHER): Payer: Managed Care, Other (non HMO)

## 2018-01-12 DIAGNOSIS — W450XXA Nail entering through skin, initial encounter: Secondary | ICD-10-CM | POA: Diagnosis not present

## 2018-01-12 DIAGNOSIS — Y92019 Unspecified place in single-family (private) house as the place of occurrence of the external cause: Secondary | ICD-10-CM | POA: Diagnosis not present

## 2018-01-12 DIAGNOSIS — Y9301 Activity, walking, marching and hiking: Secondary | ICD-10-CM | POA: Insufficient documentation

## 2018-01-12 DIAGNOSIS — Z23 Encounter for immunization: Secondary | ICD-10-CM | POA: Diagnosis not present

## 2018-01-12 DIAGNOSIS — E079 Disorder of thyroid, unspecified: Secondary | ICD-10-CM | POA: Insufficient documentation

## 2018-01-12 DIAGNOSIS — W010XXA Fall on same level from slipping, tripping and stumbling without subsequent striking against object, initial encounter: Secondary | ICD-10-CM | POA: Insufficient documentation

## 2018-01-12 DIAGNOSIS — Y998 Other external cause status: Secondary | ICD-10-CM | POA: Diagnosis not present

## 2018-01-12 DIAGNOSIS — Z87891 Personal history of nicotine dependence: Secondary | ICD-10-CM | POA: Diagnosis not present

## 2018-01-12 DIAGNOSIS — S91332A Puncture wound without foreign body, left foot, initial encounter: Secondary | ICD-10-CM | POA: Diagnosis not present

## 2018-01-12 DIAGNOSIS — S6992XA Unspecified injury of left wrist, hand and finger(s), initial encounter: Secondary | ICD-10-CM | POA: Diagnosis present

## 2018-01-12 DIAGNOSIS — Z79899 Other long term (current) drug therapy: Secondary | ICD-10-CM | POA: Diagnosis not present

## 2018-01-12 MED ORDER — CIPROFLOXACIN HCL 500 MG PO TABS: 500.0000 mg | ORAL_TABLET | Freq: Two times a day (BID) | ORAL | 0 refills | Status: DC

## 2018-01-12 MED ORDER — TETANUS-DIPHTH-ACELL PERTUSSIS 5-2.5-18.5 LF-MCG/0.5 IM SUSP
0.5000 mL | Freq: Once | INTRAMUSCULAR | Status: AC
  Administered 2018-01-12: 0.5 mL via INTRAMUSCULAR
  Filled 2018-01-12: qty 0.5

## 2018-01-12 NOTE — Discharge Instructions (Signed)
General instructions Do not put pressure on any part of the cast or splint until it is fully hardened. This may take several hours. Take over-the-counter and prescription medicines only as told by your health care provider. Do not use any tobacco products, including cigarettes, chewing tobacco, or e-cigarettes. Tobacco can delay bone healing. If you need help quitting, ask your health care provider. Keep all follow-up visits as told by your health care provider. This is important. Contact a health care provider if: Your pain or swelling gets worse even though you have had treatment. You have pain, numbness, or coldness in your hand or fingers. Your cast or splint becomes loose or damaged. Get help right away if: You lose feeling in your hand or fingers. Your fingers or fingernails turn pale or blue.

## 2018-01-12 NOTE — ED Provider Notes (Signed)
[facility] EMERGENCY DEPARTMENT Provider Note   CSN: [id number] Arrival date & time: 01/12/18  1745     History   Chief Complaint Chief Complaint  Patient presents with  . Foot Injury  . Fall    HPI Shari Dawson is a 42 y.o. female who presents emergency department with chief complaint of left wrist injury and puncture wound to the foot.  She has a previous history of ORIF and nonunion of the wrist.  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  This caused her to fall forward onto the left wrist.  She has pain on the radial side of the arm where she had a previous repair.  She denies any numbness or tingling.  She is unsure of her last tetanus vaccination  HPI  Past Medical History:  Diagnosis Date  . Pyelonephritis   . Reflux   . Thyroid disease   . UTI (lower urinary tract infection)     There are no active problems to display for this patient.   Past Surgical History:  Procedure Laterality Date  . arm surgery    . CHOLECYSTECTOMY    . laproscopy    . TUBAL LIGATION       OB History   None      Home Medications    Prior to Admission medications   Medication Sig Start Date End Date Taking? Authorizing Provider  lisdexamfetamine (VYVANSE) 50 MG capsule Take 50 mg by mouth daily.   Yes [provider]  ciprofloxacin (CIPRO) 500 MG tablet Take 1 tablet (500 mg total) by mouth 2 (two) times daily. 01/12/18   Arthor Captain, PA-C  HYDROcodone-acetaminophen (NORCO/VICODIN) 5-325 MG per tablet 1-2 tablets po q 6 hours prn moderate to severe pain 01/02/13   Quita Skye, MD  promethazine (PHENERGAN) 25 MG tablet Take 1 tablet (25 mg total) by mouth every 6 (six) hours as needed for nausea. 01/02/13   Quita Skye, MD    Family History No family history on file.  Social History Social History   Tobacco Use  . Smoking status: Former Games developer  . Smokeless tobacco:  Never Used  Substance Use Topics  . Alcohol use: Yes  . Drug use: No     Allergies   Erythromycin; Penicillins; Rocephin [ceftriaxone]; Sulfa antibiotics; and Zithromax [azithromycin dihydrate]   Review of Systems Review of Systems  Ten systems reviewed and are negative for acute change, except as noted in the HPI.   Physical Exam Updated Vital Signs BP 116/78 (BP Location: Right Arm)   Pulse 73   Temp 98.1 F (36.7 C) (Oral)   Resp 18   Ht 5\' 9"  (1.753 m)   Wt 72.6 kg (160 lb)   LMP 12/19/2017   SpO2 98%   BMI 23.63 kg/m   Physical Exam  Constitutional: She is oriented to person, place, and time. She appears well-developed and well-nourished. No distress.  HENT:  Head: Normocephalic and atraumatic.  Eyes: Conjunctivae are normal. No scleral icterus.  Neck: Normal range of motion.  Cardiovascular: Normal rate, regular rhythm and normal heart sounds. Exam reveals no gallop and no friction rub.  No murmur heard. Pulmonary/Chest: Effort normal and breath sounds normal. No respiratory distress.  Abdominal: Soft. Bowel sounds are normal. She exhibits no distension and no mass. There is no tenderness. There is no guarding.  Musculoskeletal:  No deformities.  Bruising to the thenar  eminence of the left hand.  Exquisite tenderness over the scaphoid.  Pain with ulnar and radial deviation of the wrist normal pulse and sensation  Neurological: She is alert and oriented to person, place, and time.  Skin: Skin is warm and dry. She is not diaphoretic.  Small puncture wound just proximal to the base of the right great toe on the plantar surface no active bleeding  Psychiatric: Her behavior is normal.  Nursing note and vitals reviewed.    ED Treatments / Results  Labs (all labs ordered are listed, but only abnormal results are displayed) Labs Reviewed - No data to display  EKG None  Radiology Dg Hand Complete Left  Result Date: 01/12/2018 CLINICAL DATA:  Patient fell on a  nail today. Hand hip nail in that would. Pain between the thumb and forefinger. EXAM: LEFT HAND - COMPLETE 3+ VIEW COMPARISON:  None. FINDINGS: Plate and screw fixation of the distal radius. Remote ulnar styloid fracture. Carpal rows are maintained. No acute fracture of the left hand and wrist. Joint spaces are intact. No radiopaque foreign body. No apparent soft tissue emphysema or puncture wound identified. IMPRESSION: 1. No acute osseous abnormality of the left hand and wrist. 2. No retained foreign bodies in the soft tissues. 3. Plate and screw fixation of the distal radius with remote ulnar styloid fracture. Electronically Signed   By: Tollie Ethavid  Kwon M.D.   On: 01/12/2018 18:26    Procedures Procedures (including critical care time)  Medications Ordered in ED Medications  Tdap (BOOSTRIX) injection 0.5 mL (0.5 mLs Intramuscular Given 01/12/18 2152)     Initial Impression / Assessment and Plan / ED Course  I have reviewed the triage vital signs and the nursing notes.  Pertinent labs & imaging results that were available during my care of the patient were reviewed by me and considered in my medical decision making (see chart for details).   Patient with puncture wound to foot, Tdap updated, patient will be discharged with ciprofloxacin.  Patient also with tenderness over the scaphoid.  Placed in thumb spica.  She will follow-up with her orthopedist.  She appears appropriate for discharge at this time without evidence of other injuries.  X-ray reviewed and negative  `  Final Clinical Impressions(s) / ED Diagnoses   Final diagnoses:  Wrist injury, left, initial encounter  Puncture wound to foot, left, initial encounter    ED Discharge Orders        Ordered    ciprofloxacin (CIPRO) 500 MG tablet  2 times daily     01/12/18 2230       Arthor CaptainHarris, Jatavis Malek, PA-C 01/12/18 2311    Little, Ambrose Finlandachel Morgan, MD 01/13/18 [phone]

## 2018-01-12 NOTE — ED Triage Notes (Signed)
Pt states she tripped and fell injuring her L hand. She also stepped on a nail with her L foot.

## 2019-02-09 ENCOUNTER — Emergency Department (HOSPITAL_COMMUNITY): Payer: Managed Care, Other (non HMO)

## 2019-02-09 ENCOUNTER — Other Ambulatory Visit: Payer: Self-pay

## 2019-02-09 ENCOUNTER — Emergency Department (HOSPITAL_COMMUNITY)
Admission: EM | Admit: 2019-02-09 | Discharge: 2019-02-09 | Disposition: A | Discharge status: Home / Self Care | Payer: Managed Care, Other (non HMO) | Attending: Emergency Medicine | Admitting: Emergency Medicine

## 2019-02-09 ENCOUNTER — Encounter (HOSPITAL_COMMUNITY): Payer: Self-pay

## 2019-02-09 ENCOUNTER — Telehealth: Payer: Managed Care, Other (non HMO) | Admitting: Family

## 2019-02-09 DIAGNOSIS — Z888 Allergy status to other drugs, medicaments and biological substances status: Secondary | ICD-10-CM | POA: Insufficient documentation

## 2019-02-09 DIAGNOSIS — R509 Fever, unspecified: Secondary | ICD-10-CM | POA: Insufficient documentation

## 2019-02-09 DIAGNOSIS — R5383 Other fatigue: Secondary | ICD-10-CM | POA: Insufficient documentation

## 2019-02-09 DIAGNOSIS — Z881 Allergy status to other antibiotic agents status: Secondary | ICD-10-CM | POA: Diagnosis not present

## 2019-02-09 DIAGNOSIS — Z880 Allergy status to penicillin: Secondary | ICD-10-CM | POA: Diagnosis not present

## 2019-02-09 DIAGNOSIS — Z882 Allergy status to sulfonamides status: Secondary | ICD-10-CM | POA: Insufficient documentation

## 2019-02-09 DIAGNOSIS — J029 Acute pharyngitis, unspecified: Secondary | ICD-10-CM | POA: Insufficient documentation

## 2019-02-09 DIAGNOSIS — Z20828 Contact with and (suspected) exposure to other viral communicable diseases: Secondary | ICD-10-CM | POA: Diagnosis not present

## 2019-02-09 DIAGNOSIS — R34 Anuria and oliguria: Secondary | ICD-10-CM

## 2019-02-09 DIAGNOSIS — Z87891 Personal history of nicotine dependence: Secondary | ICD-10-CM | POA: Diagnosis not present

## 2019-02-09 DIAGNOSIS — R220 Localized swelling, mass and lump, head: Secondary | ICD-10-CM

## 2019-02-09 LAB — COMPREHENSIVE METABOLIC PANEL
ALT: 40 U/L (ref 0–44)
AST: 38 U/L (ref 15–41)
Albumin: 3.9 g/dL (ref 3.5–5.0)
Alkaline Phosphatase: 53 U/L (ref 38–126)
Anion gap: 12 (ref 5–15)
BUN: 8 mg/dL (ref 6–20)
CO2: 26 mmol/L (ref 22–32)
Calcium: 9 mg/dL (ref 8.9–10.3)
Chloride: 99 mmol/L (ref 98–111)
Creatinine, Ser: 0.57 mg/dL (ref 0.44–1.00)
GFR calc Af Amer: >60 mL/min (ref >60)
GFR calc non Af Amer: >60 mL/min (ref >60)
Glucose, Bld: 91 mg/dL (ref 70–99)
Potassium: 3.4 mmol/L — ABNORMAL LOW (ref 3.5–5.1)
Sodium: 137 mmol/L (ref 135–145)
Total Bilirubin: 0.2 mg/dL — ABNORMAL LOW (ref 0.3–1.2)
Total Protein: 7.1 g/dL (ref 6.5–8.1)

## 2019-02-09 LAB — CBC WITH DIFFERENTIAL/PLATELET
Abs Immature Granulocytes: 0.04 10*3/uL (ref 0.00–0.07)
Basophils Absolute: 0.1 10*3/uL (ref 0.0–0.1)
Basophils Relative: 1 %
Eosinophils Absolute: 0 10*3/uL (ref 0.0–0.5)
Eosinophils Relative: 0 %
HCT: 39.2 % (ref 36.0–46.0)
Hemoglobin: 12.8 g/dL (ref 12.0–15.0)
Immature Granulocytes: 1 %
Lymphocytes Relative: 34 %
Lymphs Abs: 2.7 10*3/uL (ref 0.7–4.0)
MCH: 33.1 pg (ref 26.0–34.0)
MCHC: 32.7 g/dL (ref 30.0–36.0)
MCV: 101.3 fL — ABNORMAL HIGH (ref 80.0–100.0)
Monocytes Absolute: 0.8 10*3/uL (ref 0.1–1.0)
Monocytes Relative: 9 %
Neutro Abs: 4.6 10*3/uL (ref 1.7–7.7)
Neutrophils Relative %: 55 %
Platelets: 283 10*3/uL (ref 150–400)
RBC: 3.87 MIL/uL (ref 3.87–5.11)
RDW: 12.6 % (ref 11.5–15.5)
WBC Morphology: INCREASED
WBC: 8.2 10*3/uL (ref 4.0–10.5)
nRBC: 0 % (ref 0.0–0.2)

## 2019-02-09 LAB — URINALYSIS, ROUTINE W REFLEX MICROSCOPIC
Bilirubin Urine: NEGATIVE
Glucose, UA: NEGATIVE mg/dL
Ketones, ur: NEGATIVE mg/dL
Leukocytes,Ua: NEGATIVE
Nitrite: NEGATIVE
Protein, ur: NEGATIVE mg/dL
Specific Gravity, Urine: 1.005 (ref 1.005–1.030)
pH: 7 (ref 5.0–8.0)

## 2019-02-09 LAB — PREGNANCY, URINE: Preg Test, Ur: NEGATIVE

## 2019-02-09 LAB — GROUP A STREP BY PCR: Group A Strep by PCR: NOT DETECTED

## 2019-02-09 LAB — SARS CORONAVIRUS 2 BY RT PCR (HOSPITAL ORDER, PERFORMED IN ~~LOC~~ HOSPITAL LAB): SARS Coronavirus 2: NEGATIVE

## 2019-02-09 MED ORDER — HYDROMORPHONE HCL 1 MG/ML IJ SOLN
0.5000 mg | Freq: Once | INTRAMUSCULAR | Status: AC
  Administered 2019-02-09: 0.5 mg via INTRAVENOUS
  Filled 2019-02-09: qty 1

## 2019-02-09 MED ORDER — LACTATED RINGERS IV BOLUS
1000.0000 mL | Freq: Once | INTRAVENOUS | Status: AC
  Administered 2019-02-09: 1000 mL via INTRAVENOUS

## 2019-02-09 MED ORDER — KETOROLAC TROMETHAMINE 15 MG/ML IJ SOLN
15.0000 mg | Freq: Once | INTRAMUSCULAR | Status: AC
Start: 2019-02-09 — End: 2019-02-09
  Administered 2019-02-09: 15 mg via INTRAVENOUS
  Filled 2019-02-09: qty 1

## 2019-02-09 NOTE — ED Provider Notes (Signed)
[facility]-EMERGENCY DEPT Provider Note   CSN: [id number] Arrival date & time: 02/09/19  1721     History   Chief Complaint No chief complaint on file.   HPI Shari Dawson is a 43 y.o. female.     HPI   42yF with fever, body aches and cough. Persistent over the last ~3 days. Fatigued. "I just feel like shit." Mild sore throat. No dyspnea. No dysuria but urine has been darker in color and she feels like she has been urinating less. Little appetite but has been trying to stay hydrated. Reports #10 unintentional weight loss in past two weeks and #50 in the last year. No v/d. No rash. No recent tick bites.  No sick contacts that she is aware of.   Past Medical History:  Diagnosis Date  . Pyelonephritis   . Reflux   . Thyroid disease   . UTI (lower urinary tract infection)     There are no active problems to display for this patient.   Past Surgical History:  Procedure Laterality Date  . arm surgery    . CHOLECYSTECTOMY    . laproscopy    . TUBAL LIGATION       OB History   No obstetric history on file.      Home Medications    Prior to Admission medications   Medication Sig Start Date End Date Taking? Authorizing Provider  acetaminophen (TYLENOL) 500 MG tablet Take 1,000 mg by mouth every 6 (six) hours as needed for mild pain, moderate pain or headache.   Yes [provider]  amphetamine-dextroamphetamine (ADDERALL) 10 MG tablet Take 10 mg by mouth daily. 01/13/19  Yes [provider]  lamoTRIgine (LAMICTAL) 100 MG tablet Take 100 mg by mouth daily. 11/27/18  Yes [provider]  lisdexamfetamine (VYVANSE) 50 MG capsule Take 50 mg by mouth daily.   Yes [provider]  zaleplon (SONATA) 5 MG capsule Take 5 mg by mouth at bedtime as needed for sleep.  01/13/19  Yes [provider]    Family History No family history on file.  Social History Social History   Tobacco Use  . Smoking status: Former  Games developermoker  . Smokeless tobacco: Never Used  Substance Use Topics  . Alcohol use: Yes  . Drug use: No     Allergies   Erythromycin, Rocephin [ceftriaxone], Sulfa antibiotics, Zithromax [azithromycin dihydrate], and Penicillins   Review of Systems Review of Systems   All systems reviewed and negative, other than as noted in HPI.  Physical Exam Updated Vital Signs BP 115/82 (BP Location: Right Arm)   Pulse 76   Temp 99.7 F (37.6 C) (Oral)   Resp 17   Ht 5\' 9"  (1.753 m)   Wt 61.2 kg   LMP 01/22/2019 (Approximate)   SpO2 100%   BMI 19.94 kg/m   Physical Exam Vitals signs and nursing note reviewed.  Constitutional:      Appearance: She is well-developed.     Comments: Laying in bed. Seems tired but not toxic.   HENT:     Head: Normocephalic and atraumatic.     Right Ear: Tympanic membrane normal.     Left Ear: Tympanic membrane normal.     Mouth/Throat:     Mouth: Mucous membranes are moist.     Pharynx: Oropharynx is clear.     Comments: Mild pharyngitis. No exudate. Uvula midline. Handling secretions. Normal sounding voice. Neck supple.   Eyes:     General:  Right eye: No discharge.        Left eye: No discharge.     Conjunctiva/sclera: Conjunctivae normal.  Neck:     Musculoskeletal: Neck supple.  Cardiovascular:     Rate and Rhythm: Normal rate and regular rhythm.     Heart sounds: Normal heart sounds. No murmur. No friction rub. No gallop.   Pulmonary:     Effort: Pulmonary effort is normal. No respiratory distress.     Breath sounds: Normal breath sounds.  Abdominal:     General: There is no distension.     Palpations: Abdomen is soft.     Tenderness: There is no abdominal tenderness.  Musculoskeletal:        General: No tenderness.     Right lower leg: No edema.     Left lower leg: No edema.  Skin:    General: Skin is warm and dry.     Findings: No rash.  Neurological:     Mental Status: She is alert.  Psychiatric:        Behavior: Behavior  normal.        Thought Content: Thought content normal.      ED Treatments / Results  Labs (all labs ordered are listed, but only abnormal results are displayed) Labs Reviewed  CBC WITH DIFFERENTIAL/PLATELET - Abnormal; Notable for the following components:      Result Value   MCV 101.3 (*)    All other components within normal limits  URINALYSIS, ROUTINE W REFLEX MICROSCOPIC - Abnormal; Notable for the following components:   Hgb urine dipstick SMALL (*)    Bacteria, UA FEW (*)    All other components within normal limits  COMPREHENSIVE METABOLIC PANEL - Abnormal; Notable for the following components:   Potassium 3.4 (*)    Total Bilirubin 0.2 (*)    All other components within normal limits  SARS CORONAVIRUS 2 (HOSPITAL ORDER, PERFORMED IN [facility])  GROUP A STREP BY PCR  CULTURE, BLOOD (ROUTINE X 2)  CULTURE, BLOOD (ROUTINE X 2)  URINE CULTURE  PREGNANCY, URINE    EKG None  Radiology Dg Chest Portable 1 View  Result Date: 02/09/2019 CLINICAL DATA:  Fever, dyspnea EXAM: PORTABLE CHEST 1 VIEW COMPARISON:  02/06/2019 FINDINGS: The heart size and mediastinal contours are within normal limits. Both lungs are clear. The visualized skeletal structures are unremarkable. IMPRESSION: No acute abnormality of the lungs in AP portable projection. Electronically Signed   By: Lauralyn PrimesAlex  Bibbey M.D.   On: 02/09/2019 18:58    Procedures Procedures (including critical care time)  Medications Ordered in ED Medications  ketorolac (TORADOL) 15 MG/ML injection 15 mg (15 mg Intravenous Given 02/09/19 1853)  lactated ringers bolus 1,000 mL (1,000 mLs Intravenous New Bag/Given 02/09/19 1852)     Initial Impression / Assessment and Plan / ED Course  I have reviewed the triage vital signs and the nursing notes.  Pertinent labs & imaging results that were available during my care of the patient were reviewed by me and considered in my medical decision making (see chart for  details).  42yF with fever and body aches. Symptoms of body aches, fatigue, fever, etc common to many febrile illnesses and little in terms of additional history to suggest clear source. No meningismus. No concerning skin lesions. Abdominal exam is benign.  Strep neg. UA looks ok. COVID negative. CXR w/o focal findings.  >20% bands has me concerned although w/u has otherwise been reassuring. He looks tired but not  toxic. Has been normotensive the entire time she has been in the ED. Will send blood and urine cultures but otherwise will discharge. Continue to stay well hydrated. PRN tylenol/ibuprofen. Return for worsening symptoms. Close outpt FU otherwise.   Final Clinical Impressions(s) / ED Diagnoses   Final diagnoses:  Febrile illness, acute    ED Discharge Orders    None       Virgel Manifold, MD 02/09/19 2314

## 2019-02-09 NOTE — Progress Notes (Signed)
Based on what you shared with me, I feel your condition warrants further evaluation and I recommend that you be seen for a face to face office visit.  NOTE: If you entered your credit card information for this eVisit, you will not be charged. You may see a "hold" on your card for the $35 but that hold will drop off and you will not have a charge processed.  If you are having a true medical emergency please call 911.    Given your symptoms of decreased urine output and facial swelling you need to be seen today.  For an urgent face to face visit, [facility] [place] has five urgent care centers for your convenience:    [email or web address] to reserve your spot online an avoid wait times  [facility] (New Address!) [address] [place], [place] [address] *Just off [address], across the road from [place] hours of operation: Monday-Friday, 12 PM to 6 PM  Closed Saturday & Sunday   The following sites will take your insurance:  . [facility]    [phone]                  Get Driving Directions  [address], [place] [address] . 10 am to 8 pm Monday-Friday . 12 pm to 8 pm Saturday-Sunday   . [facility] at [place]                  Get Driving Directions  [address], [place], [place] [address] . 8 am to 8 pm Monday-Friday . 9 am to 6 pm Saturday . 11 am to 6 pm Sunday   . [facility] at [place]                  Get Driving Directions   [address], [place] [address] . 8 am to 8 pm Monday-Friday . 8 am to 4 pm Saturday-Sunday    . [facility] at [place]                    Get Driving Directions  [phone]  [address] [place],  [address]  . Monday-Friday, 12 PM to 6 PM    Your e-visit answers were reviewed by a board certified  advanced clinical practitioner to complete your personal care plan.  Thank you for using e-Visits.

## 2019-02-09 NOTE — ED Triage Notes (Signed)
Per EMS: Pt c/o of cough, body-aches, chest tightness, discolored urine and hot/cold flashes.  Pt denies fever.  Pt took 1000 mg tylenol at 2 pm today.

## 2019-02-09 NOTE — ED Notes (Signed)
Pt appears to be upset on discharger requesting her labs so "a real doctor can look at them." Pt was encouraged to utilize MyChart, that her labs would be there, pt stated, "I already looked, they are not there. This was the biggest waste of time." Pt stated she waited for 6 hours to get water. Writer of this note offered to get the pt water or something else to drink, pt refused. Pt was provided with a print out of lab results.

## 2019-02-11 LAB — URINE CULTURE: Culture: NO GROWTH

## 2019-02-14 LAB — CULTURE, BLOOD (ROUTINE X 2)
Culture: NO GROWTH
Culture: NO GROWTH
Special Requests: ADEQUATE

## 2020-06-02 IMAGING — DX PORTABLE CHEST - 1 VIEW
1 series · 1 of 1 positions shown · non-contrast
Comparison: 02/06/2019

CLINICAL DATA: Fever, dyspnea

EXAM:
PORTABLE CHEST 1 VIEW

[chest ap]
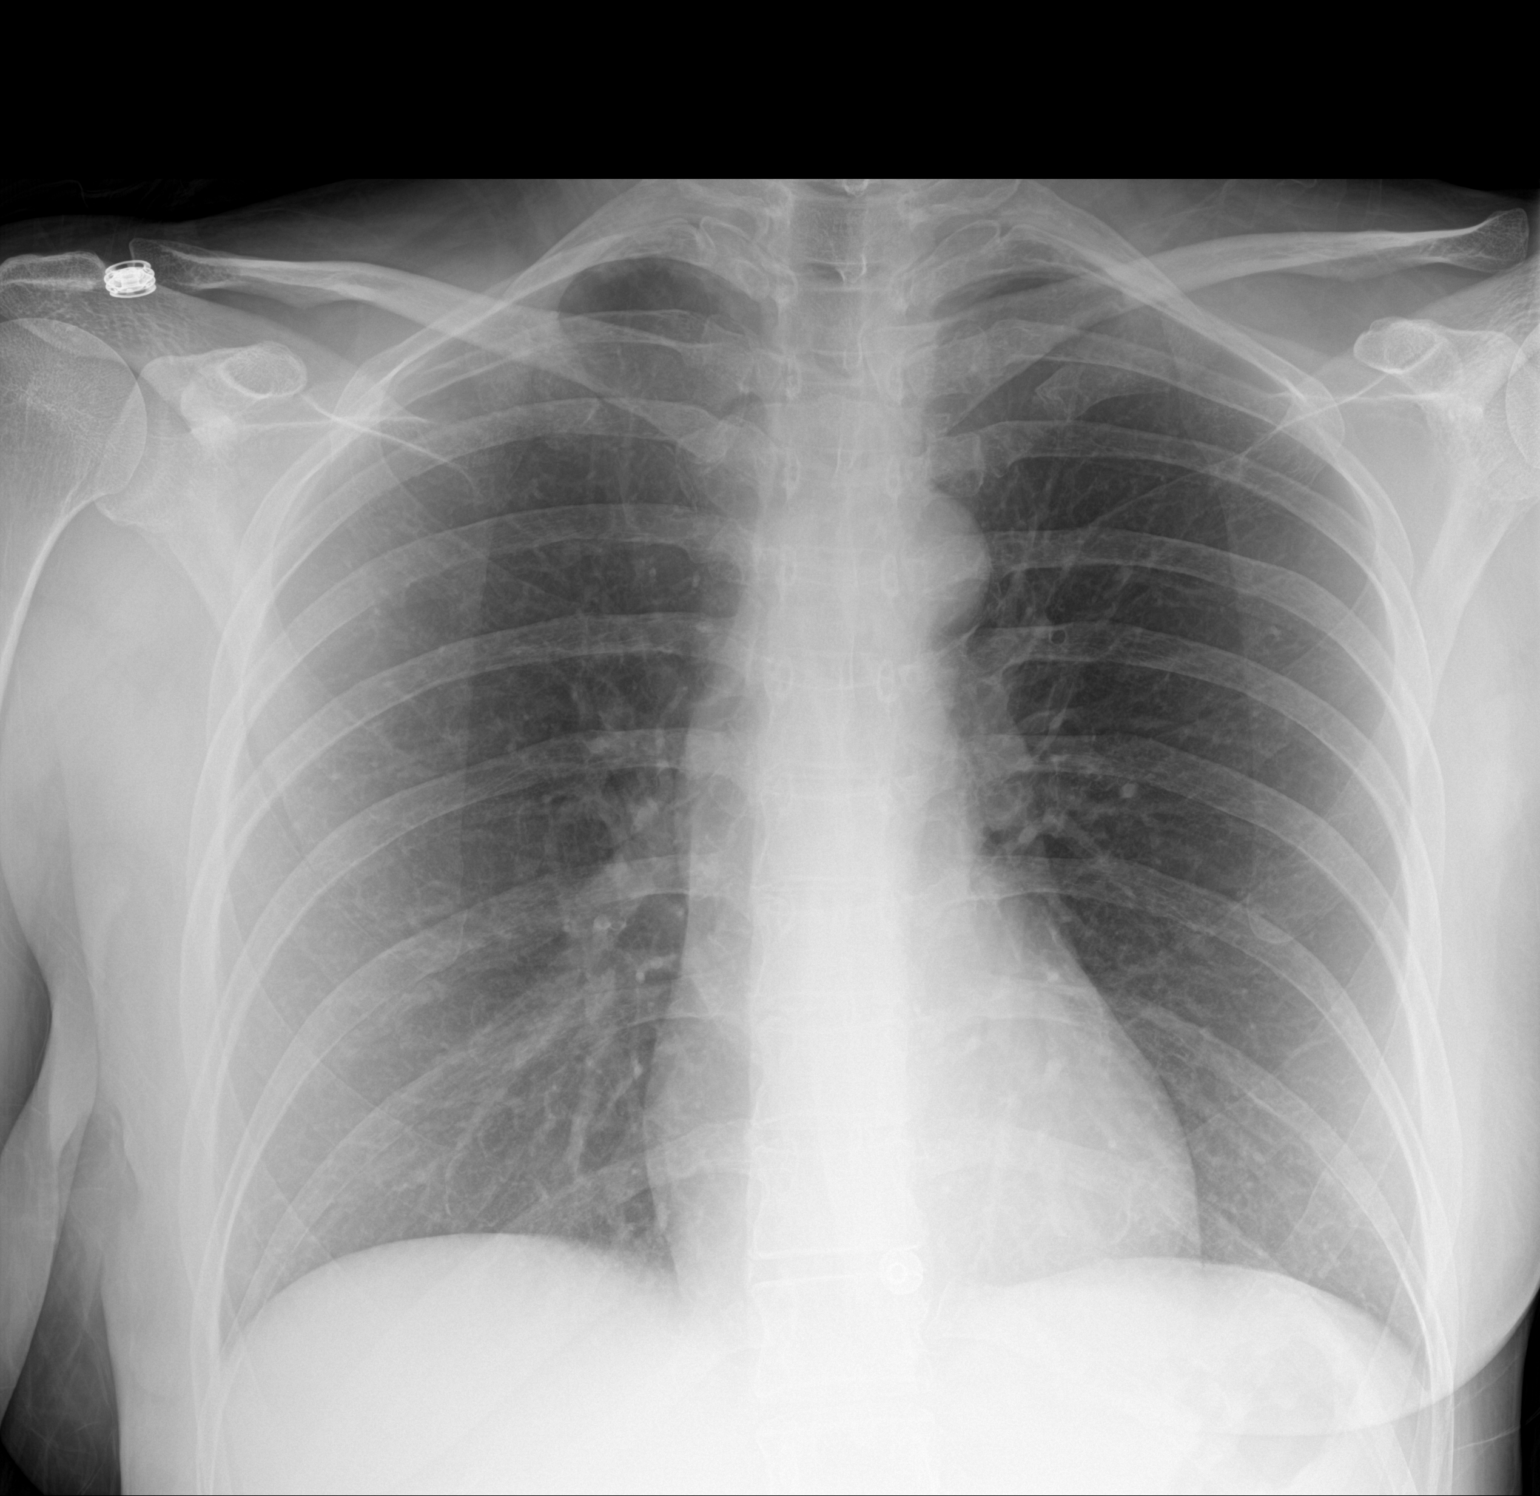

[1 of 1 positions shown; findings below may reference images not displayed]

FINDINGS: The heart size and mediastinal contours are within normal limits.
Both lungs are clear. The visualized skeletal structures are
unremarkable.
IMPRESSION: No acute abnormality of the lungs in AP portable projection.
